# Patient Record
Sex: Female | Born: 2000 | Race: Black or African American | Hispanic: Yes | Marital: Single
Health system: Midwestern US, Community
[De-identification: ages and names within clinical notes are randomized; demographics above are authoritative.]

## PROBLEM LIST (undated history)

## (undated) DIAGNOSIS — N946 Dysmenorrhea, unspecified: Secondary | ICD-10-CM

## (undated) DIAGNOSIS — Z202 Contact with and (suspected) exposure to infections with a predominantly sexual mode of transmission: Secondary | ICD-10-CM

## (undated) DIAGNOSIS — Z0000 Encounter for general adult medical examination without abnormal findings: Secondary | ICD-10-CM

## (undated) DIAGNOSIS — B3731 Acute candidiasis of vulva and vagina: Secondary | ICD-10-CM

## (undated) DIAGNOSIS — N939 Abnormal uterine and vaginal bleeding, unspecified: Secondary | ICD-10-CM

---

## 2020-08-12 ENCOUNTER — Other Ambulatory Visit: Payer: Self-pay

## 2020-08-12 ENCOUNTER — Emergency Department (HOSPITAL_COMMUNITY): Payer: Self-pay

## 2020-08-12 ENCOUNTER — Emergency Department (HOSPITAL_COMMUNITY)
Admission: EM | Admit: 2020-08-12 | Discharge: 2020-08-12 | Disposition: A | Discharge status: Home / Self Care | Payer: Self-pay | Attending: Emergency Medicine | Admitting: Emergency Medicine

## 2020-08-12 DIAGNOSIS — R000 Tachycardia, unspecified: Secondary | ICD-10-CM | POA: Insufficient documentation

## 2020-08-12 DIAGNOSIS — Z20822 Contact with and (suspected) exposure to covid-19: Secondary | ICD-10-CM | POA: Insufficient documentation

## 2020-08-12 DIAGNOSIS — J101 Influenza due to other identified influenza virus with other respiratory manifestations: Secondary | ICD-10-CM | POA: Insufficient documentation

## 2020-08-12 LAB — CBC
HCT: 44 % (ref 36.0–46.0)
Hemoglobin: 14.6 g/dL (ref 12.0–15.0)
MCH: 29.9 pg (ref 26.0–34.0)
MCHC: 33.2 g/dL (ref 30.0–36.0)
MCV: 90.2 fL (ref 80.0–100.0)
Platelets: 322 10*3/uL (ref 150–400)
RBC: 4.88 MIL/uL (ref 3.87–5.11)
RDW: 12.4 % (ref 11.5–15.5)
WBC: 9 10*3/uL (ref 4.0–10.5)
nRBC: 0 % (ref 0.0–0.2)

## 2020-08-12 LAB — BASIC METABOLIC PANEL
Anion gap: 10 (ref 5–15)
BUN: 9 mg/dL (ref 6–20)
CO2: 24 mmol/L (ref 22–32)
Calcium: 9.5 mg/dL (ref 8.9–10.3)
Chloride: 98 mmol/L (ref 98–111)
Creatinine, Ser: 1.2 mg/dL — ABNORMAL HIGH (ref 0.44–1.00)
GFR, Estimated: >60 mL/min (ref >60)
Glucose, Bld: 97 mg/dL (ref 70–99)
Potassium: 3.8 mmol/L (ref 3.5–5.1)
Sodium: 132 mmol/L — ABNORMAL LOW (ref 135–145)

## 2020-08-12 LAB — URINALYSIS, ROUTINE W REFLEX MICROSCOPIC
Bilirubin Urine: NEGATIVE
Glucose, UA: NEGATIVE mg/dL
Hgb urine dipstick: NEGATIVE
Ketones, ur: 5 mg/dL — AB
Nitrite: NEGATIVE
Protein, ur: NEGATIVE mg/dL
Specific Gravity, Urine: 1.006 (ref 1.005–1.030)
pH: 7 (ref 5.0–8.0)

## 2020-08-12 LAB — TROPONIN I (HIGH SENSITIVITY)
Troponin I (High Sensitivity): 3 ng/L (ref <18)
Troponin I (High Sensitivity): 3 ng/L (ref <18)

## 2020-08-12 LAB — RESP PANEL BY RT-PCR (FLU A&B, COVID) ARPGX2
Influenza A by PCR: POSITIVE — AB
Influenza B by PCR: NEGATIVE
SARS Coronavirus 2 by RT PCR: NEGATIVE

## 2020-08-12 LAB — LACTIC ACID, PLASMA: Lactic Acid, Venous: 1.2 mmol/L (ref 0.5–1.9)

## 2020-08-12 LAB — I-STAT BETA HCG BLOOD, ED (MC, WL, AP ONLY): I-stat hCG, quantitative: <5 m[IU]/mL (ref <5)

## 2020-08-12 MED ORDER — ACETAMINOPHEN 325 MG PO TABS
650.0000 mg | ORAL_TABLET | Freq: Once | ORAL | Status: AC
  Administered 2020-08-12: 650 mg via ORAL
  Filled 2020-08-12: qty 2

## 2020-08-12 MED ORDER — KETOROLAC TROMETHAMINE 30 MG/ML IJ SOLN
15.0000 mg | Freq: Once | INTRAMUSCULAR | Status: AC
  Administered 2020-08-12: 15 mg via INTRAVENOUS
  Filled 2020-08-12: qty 1

## 2020-08-12 MED ORDER — SODIUM CHLORIDE 0.9 % IV BOLUS
1000.0000 mL | Freq: Once | INTRAVENOUS | Status: AC
  Administered 2020-08-12: 1000 mL via INTRAVENOUS

## 2020-08-12 NOTE — Discharge Instructions (Addendum)
Drink lots of fluids.  You may use Tylenol or ibuprofen for fevers or body aches.  Return to the emergency room if you have any worsening symptoms.  Your creatinine (kidney function) is mildly elevated and should be rechecked by your primary care doctor.

## 2020-08-12 NOTE — ED Triage Notes (Signed)
BIB [facility] with complaints of chest pain for 3 days after smoking marijuana and taking taking pill of unknown substance. Pt is Aox4, was ambulatory from ambulance to room with no issue.  HR 140-sinus tach BP-140/90  SpO2-99% Resp- 18

## 2020-08-12 NOTE — ED Provider Notes (Signed)
[facility] EMERGENCY DEPARTMENT Provider Note   CSN: [id number] Arrival date & time: 08/12/20  1550     History Chief Complaint  Patient presents with   Chest Pain    Kristen Sanders is a 20 y.o. female.  Patient is a 20 year old female who presents with chest pain.  She reports pain in the center of her chest for the last 3 days.  She describes it as a sharp pain.  Its worse with certain movements.  Its not worse with breathing.  She has no associated shortness of breath.  She is febrile here and says that she has had a cough for the last few days.  No nausea or vomiting.  No known sick contacts.  She does report that she has been smoking marijuana and vaping.  She did take a pill last night.  She does not know what was in it.  She was not able to sleep through the night.  However the chest pain started prior to this.      No past medical history on file.  There are no problems to display for this patient.   The histories are not reviewed yet. Please review them in the "History" navigator section and refresh this SmartLink.   OB History   No obstetric history on file.     No family history on file.     Home Medications Prior to Admission medications   Not on File    Allergies    Patient has no known allergies.  Review of Systems   Review of Systems  Constitutional:  Positive for fatigue and fever. Negative for chills and diaphoresis.  HENT:  Negative for congestion, rhinorrhea and sneezing.   Eyes: Negative.   Respiratory:  Positive for cough. Negative for chest tightness and shortness of breath.   Cardiovascular:  Positive for chest pain. Negative for leg swelling.  Gastrointestinal:  Negative for abdominal pain, blood in stool, diarrhea, nausea and vomiting.  Genitourinary:  Negative for difficulty urinating, flank pain, frequency and hematuria.  Musculoskeletal:  Negative for arthralgias and back pain.  Skin:  Negative for rash.   Neurological:  Negative for dizziness, speech difficulty, weakness, numbness and headaches.   Physical Exam Updated Vital Signs BP 134/87   Pulse (!) 119   Temp 99.7 F (37.6 C) (Oral)   Resp (!) 27   Ht 5\' 7"  (1.702 m)   Wt 90.7 kg   SpO2 94%   BMI 31.32 kg/m   Physical Exam Constitutional:      Appearance: She is well-developed.  HENT:     Head: Normocephalic and atraumatic.  Eyes:     Pupils: Pupils are equal, round, and reactive to light.  Cardiovascular:     Rate and Rhythm: Regular rhythm. Tachycardia present.     Heart sounds: Normal heart sounds.  Pulmonary:     Effort: Pulmonary effort is normal. No respiratory distress.     Breath sounds: Normal breath sounds. No wheezing or rales.  Chest:     Chest wall: No tenderness.  Abdominal:     General: Bowel sounds are normal.     Palpations: Abdomen is soft.     Tenderness: There is no abdominal tenderness. There is no guarding or rebound.  Musculoskeletal:        General: Normal range of motion.     Cervical back: Normal range of motion and neck supple.     Comments: No peripheral edema  Lymphadenopathy:  Cervical: No cervical adenopathy.  Skin:    General: Skin is warm and dry.     Findings: No rash.  Neurological:     Mental Status: She is alert and oriented to person, place, and time.    ED Results / Procedures / Treatments   Labs (all labs ordered are listed, but only abnormal results are displayed) Labs Reviewed  RESP PANEL BY RT-PCR (FLU A&B, COVID) ARPGX2 - Abnormal; Notable for the following components:      Result Value   Influenza A by PCR POSITIVE (*)    All other components within normal limits  BASIC METABOLIC PANEL - Abnormal; Notable for the following components:   Sodium 132 (*)    Creatinine, Ser 1.20 (*)    All other components within normal limits  URINALYSIS, ROUTINE W REFLEX MICROSCOPIC - Abnormal; Notable for the following components:   Ketones, ur 5 (*)    Leukocytes,Ua  SMALL (*)    Bacteria, UA RARE (*)    All other components within normal limits  CBC  LACTIC ACID, PLASMA  I-STAT BETA HCG BLOOD, ED ([facility], [facility], [facility] ONLY)  TROPONIN I (HIGH SENSITIVITY)  TROPONIN I (HIGH SENSITIVITY)    EKG EKG Interpretation  Date/Time:  Tuesday August 12 2020 15:58:42 EDT Ventricular Rate:  116 PR Interval:  124 QRS Duration: 76 QT Interval:  310 QTC Calculation: 431 R Axis:   63 Text Interpretation: Sinus tachycardia Borderline T wave abnormalities Confirmed by Rolan Bucco [phone]) on 08/12/2020 4:03:09 PM  Radiology DG Chest Portable 1 View  Result Date: 08/12/2020 CLINICAL DATA:  Chest pain for 3 days. EXAM: PORTABLE CHEST 1 VIEW COMPARISON:  None. FINDINGS: Lungs clear. Heart size normal. No pneumothorax or pleural fluid. No bony abnormality. IMPRESSION: Negative chest. Electronically Signed   By: Drusilla Kanner M.D.   On: 08/12/2020 17:05    Procedures Procedures   Medications Ordered in ED Medications  ketorolac (TORADOL) 30 MG/ML injection 15 mg (has no administration in time range)  acetaminophen (TYLENOL) tablet 650 mg (650 mg Oral Given 08/12/20 1636)  sodium chloride 0.9 % bolus 1,000 mL (0 mLs Intravenous Stopped 08/12/20 1800)    ED Course  I have reviewed the triage vital signs and the nursing notes.  Pertinent labs & imaging results that were available during my care of the patient were reviewed by me and considered in my medical decision making (see chart for details).    MDM Rules/Calculators/A&P                          Patient is a 20 year old who presents with cough and some chest pain.  Chest x-ray is clear without evidence of pneumonia or pneumothorax.  She is febrile.  Her COVID test is negative.  Her influenza test is positive.  Her labs are nonconcerning.  Her creatinine is mildly elevated.  Her lactate is normal.  She had some tachycardia on arrival up to 130.  This improved greatly with IV fluids.  Her heart rate now is around  100-105.  She feels much better.  She has minimal ongoing chest pain only with coughing.  No shortness of breath.  No other symptoms that sound more concerning for PE.  She was discharged home in good condition.  Symptomatic care instructions were given.  Return precautions were given. Final Clinical Impression(s) / ED Diagnoses Final diagnoses:  Influenza A    Rx / DC Orders ED Discharge Orders  None        Rolan Bucco, MD 08/12/20 2023

## 2021-11-03 ENCOUNTER — Encounter: Admit: 2021-11-03 | Discharge: 2021-11-03 | Payer: MEDICAID | Primary: Diagnostic Radiology

## 2021-11-03 DIAGNOSIS — Z124 Encounter for screening for malignant neoplasm of cervix: Secondary | ICD-10-CM

## 2021-11-03 MED ORDER — MEDROXYPROGESTERONE ACETATE 150 MG/ML IM SUSP
150 | INTRAMUSCULAR | 3 refills | 90.00000 days | Status: DC
Start: 2021-11-03 — End: 2021-11-09

## 2021-11-03 NOTE — Progress Notes (Signed)
Diana Payne is a 21 y.o. female who presents to the office today for the following:    Chief Complaint   Patient presents with    New Patient     Establish care.       History reviewed. No pertinent past medical history.     History reviewed. No pertinent surgical history.     History reviewed. No pertinent family history.     Social History     Tobacco Use    Smoking status: Some Days     Packs/day: .25     Types: Cigarettes    Smokeless tobacco: Never   Vaping Use    Vaping Use: Every day    Substances: THC    Devices: Disposable   Substance Use Topics    Alcohol use: Yes     Comment: Drinks at least 3 times per week.    Drug use: Yes     Frequency: 7.0 times per week     Types: Marijuana [name])     Comment: Stated smokes weed every day.        HPI  Patient here to establish as new patient with provider with PMH anxiety, depression, dysmenorrhea, tobacco dependence and maijuana use. Follows with psychiatry and therapist. Previously received Depo injection from [facility] prior to relocating to area. Patient endorses heavy irregular menstrual cycles with clots and lower abdominal pain prior to starting depo 3 years ago.    Recently treated for chlamydia. Completed abx this week. Unknown if partner treated. Would like to be re-screened. Denies symptoms today.    Chief Complaint   Patient presents with    New Patient     Establish care.       Current Outpatient Medications on File Prior to Visit   Medication Sig Dispense Refill    sertraline (ZOLOFT) 25 MG tablet Take 1 tablet by mouth daily       No current facility-administered medications on file prior to visit.        Current Outpatient Medications   Medication Sig Dispense Refill    sertraline (ZOLOFT) 25 MG tablet Take 1 tablet by mouth daily      medroxyPROGESTERone (DEPO-PROVERA) 150 MG/ML injection Inject 1 mL into the muscle every 3 months 1 mL 3     No current facility-administered medications for this visit.          Review of Systems    Constitutional: Negative.    HENT: Negative.     Eyes: Negative.    Respiratory: Negative.     Cardiovascular: Negative.    Gastrointestinal: Negative.    Endocrine: Negative.    Genitourinary: Negative.    Musculoskeletal: Negative.    Skin: Negative.    Neurological: Negative.    Hematological: Negative.    Psychiatric/Behavioral:  Negative for agitation, behavioral problems, confusion, decreased concentration, dysphoric mood, hallucinations, self-injury, sleep disturbance and suicidal ideas. The patient is nervous/anxious. The patient is not hyperactive.         Depression         BP (!) 104/58 (Site: Left Upper Arm, Position: Sitting, Cuff Size: Large Adult)   Pulse 78   Temp 97.6 F (36.4 C) (Oral)   Resp 20   Ht 5\' 7"  (1.702 m)   Wt 193 lb 8 oz (87.8 kg)   LMP  (LMP Unknown)   SpO2 99%   BMI 30.31 kg/m       Physical Exam  Constitutional:  Appearance: Normal appearance.   HENT:      Head: Normocephalic and atraumatic.   Cardiovascular:      Rate and Rhythm: Normal rate and regular rhythm.      Heart sounds: Normal heart sounds.   Pulmonary:      Effort: Pulmonary effort is normal.      Breath sounds: Normal breath sounds.   Abdominal:      General: Abdomen is flat. Bowel sounds are normal.      Palpations: Abdomen is soft.   Genitourinary:     General: Normal vulva.      Rectum: Normal.   Neurological:      General: No focal deficit present.      Mental Status: She is alert and oriented to person, place, and time.   Psychiatric:         Mood and Affect: Mood normal.         Behavior: Behavior normal.         Thought Content: Thought content normal.         Judgment: Judgment normal.          1. Encounter for Papanicolaou smear for cervical cancer screening  -     PAP IG, CT-NG-TV, Aptima HPV and rfx 16/18,45 ([id number])  PAP obtained with minimal discomfort reported by patient.   Educated patient that pending WNL will repeat in 3 years.    2. Anxiety and depression      This is stable. PHQ2 score  of 1.      Follows with psychiatry and therapist.     Continue Zoloft as directed.        Denies SI HI and AVH. If SI HI or AVH contact provider immediately or seek ER care.       Discussed adjuvant therapy to include meditation, massage, and deep breathing exercises.       Patient declines referral to psychiatry.     3. Dysmenorrhea  -     medroxyPROGESTERone (DEPO-PROVERA) 150 MG/ML injection; Inject 1 mL into the muscle every 3 months, Disp-1 mL, R-3NO PRINT  Patient has been taking Depo-Provera for 3 years for control of heavy painful menstrual cycles. Denies menstrual cycle with use.  Last injection June 2023 and is currently past due.  Will refill Depo now. Educated to return to office for POC Urine Pregnancy and Depo administration upon receipt of injectable from pharmacy.  Discussed risk factors such as depression, mood changes and weight gain with use. Patient denies symptoms.     4. Encounter for annual routine gynecological examination  Vaginal exam completed via speculum with minimal discomfort reported by patient.   No adnexal tenderness with palpation.    5. Screen for STD (sexually transmitted disease)  -     PAP IG, CT-NG-TV, Aptima HPV and rfx 16/18,45 ([id number])  Recent chlamydial infection. Completed ABX last week. Would like re-screen today. Will check swab now.  Educated patient on importance of partner treatment to reduce risk of reinfection.  Educated patient on importance of routine condom use to reduce risk of STD exposure.     6. Tobacco user  Recommended to work on cessation to reduce risk of COPD, shortness of breath, oxygen dependence, CKD, CVD, cancer or death.        We discussed the expected course, resolution and complications of the diagnosis(es) in detail.  Medication risks, benefits, costs, interactions, and alternatives were discussed as indicated.  I advised her to contact the office if  her condition worsens, changes or fails to improve as anticipated. She expressed understanding  with the diagnosis(es) and plan.     Patient verbalizes understanding of plan of care as discussed above    Return if symptoms worsen or fail to improve.

## 2021-11-09 MED ORDER — MEDROXYPROGESTERONE ACETATE 150 MG/ML IM SUSP
150 | INTRAMUSCULAR | 3 refills | 90.00000 days | Status: DC
Start: 2021-11-09 — End: 2022-10-27

## 2021-11-09 NOTE — Telephone Encounter (Signed)
Patient called in and wanted to know if the medication medroxyPROGESTERone (DEPO-PROVERA) 150 MG/ML injection was sent to the [facility].  I didn't see where it was sent.

## 2021-11-09 NOTE — Addendum Note (Signed)
Addended by: Harlow Ohms on: 11/09/2021 07:40 PM     Modules accepted: Orders

## 2021-11-10 LAB — PAP IG, CT-NG-TV, APTIMA HPV AND RFX 16/18,45 (199315)
.: 0
Chlamydia trachomatis, NAA: NEGATIVE
HPV Aptima: NEGATIVE
Neisseria Gonorrhoeae, NAA: NEGATIVE
Trichomonas Vaginalis by NAA: NEGATIVE

## 2021-11-25 ENCOUNTER — Encounter: Admit: 2021-11-25 | Discharge: 2021-11-25 | Payer: MEDICAID

## 2021-11-25 DIAGNOSIS — N946 Dysmenorrhea, unspecified: Secondary | ICD-10-CM

## 2021-11-25 LAB — AMB POC URINE PREGNANCY TEST, VISUAL COLOR COMPARISON: HCG, Pregnancy, Urine, POC: NEGATIVE

## 2021-11-25 MED ORDER — MEDROXYPROGESTERONE ACETATE 150 MG/ML IM SUSP
150 MG/ML | Freq: Once | INTRAMUSCULAR | Status: AC
Start: 2021-11-25 — End: 2021-11-25
  Administered 2021-11-25: 19:00:00 150 mg via INTRAMUSCULAR

## 2021-12-02 ENCOUNTER — Encounter

## 2021-12-04 LAB — URINALYSIS WITH MICROSCOPIC
Bilirubin Urine: NEGATIVE
Blood, Urine: NEGATIVE
Glucose, Ur: NEGATIVE
Ketones, Urine: NEGATIVE
Leukocyte Esterase, Urine: NEGATIVE
Nitrite, Urine: NEGATIVE
Protein, UA: NEGATIVE
Specific Gravity, UA: 1.02 (ref 1.005–1.030)
Urobilinogen, Urine: 1 mg/dL (ref 0.2–1.0)
pH, UA: 5.5 (ref 5.0–7.5)

## 2021-12-04 LAB — CBC WITH AUTO DIFFERENTIAL
Basophils %: 1 %
Basophils Absolute: 0.1 10*3/uL (ref 0.0–0.2)
Eosinophils %: 3 %
Eosinophils Absolute: 0.3 10*3/uL (ref 0.0–0.4)
Hematocrit: 44.6 % (ref 34.0–46.6)
Hemoglobin: 14.8 g/dL (ref 11.1–15.9)
Immature Grans (Abs): 0 10*3/uL (ref 0.0–0.1)
Immature Granulocytes %: 0 %
Lymphocytes %: 30 %
Lymphocytes Absolute: 2.9 10*3/uL (ref 0.7–3.1)
MCH: 29.7 pg (ref 26.6–33.0)
MCHC: 33.2 g/dL (ref 31.5–35.7)
MCV: 89 fL (ref 79–97)
Monocytes %: 6 %
Monocytes Absolute: 0.6 10*3/uL (ref 0.1–0.9)
Neutrophils %: 60 %
Neutrophils Absolute: 5.9 10*3/uL (ref 1.4–7.0)
Platelets: 357 10*3/uL (ref 150–450)
RBC: 4.99 x10E6/uL (ref 3.77–5.28)
RDW: 12.9 % (ref 11.7–15.4)
WBC: 9.9 10*3/uL (ref 3.4–10.8)

## 2021-12-04 LAB — LIPID PANEL
Cholesterol: 174 mg/dL (ref 100–199)
HDL: 32 mg/dL — ABNORMAL LOW (ref >39)
LDL Calculated: 118 mg/dL — ABNORMAL HIGH (ref 0–99)
Triglycerides: 132 mg/dL (ref 0–149)
VLDL Cholesterol Calculated: 24 mg/dL (ref 5–40)

## 2021-12-04 LAB — MICROSCOPIC EXAMINATION
Casts UA: NONE SEEN /lpf
RBC, UA: NONE SEEN /hpf (ref 0–2)

## 2021-12-04 LAB — COMPREHENSIVE METABOLIC PANEL
ALT: 10 IU/L (ref 0–32)
AST: 12 IU/L (ref 0–40)
Albumin/Globulin Ratio: 1.4 (ref 1.2–2.2)
Albumin: 4.4 g/dL (ref 4.0–5.0)
Alkaline Phosphatase: 76 IU/L (ref 44–121)
BUN/Creatinine Ratio: 7 — ABNORMAL LOW (ref 9–23)
BUN: 8 mg/dL (ref 6–20)
CO2: 24 mmol/L (ref 20–29)
Calcium: 9.4 mg/dL (ref 8.7–10.2)
Chloride: 101 mmol/L (ref 96–106)
Creatinine: 1.12 mg/dL — ABNORMAL HIGH (ref 0.57–1.00)
Est, Glom Filt Rate: 71 mL/min/1.73 (ref >59)
Globulin, Total: 3.1 g/dL (ref 1.5–4.5)
Glucose: 61 mg/dL — ABNORMAL LOW (ref 70–99)
Potassium: 3.9 mmol/L (ref 3.5–5.2)
Sodium: 139 mmol/L (ref 134–144)
Total Bilirubin: 0.2 mg/dL (ref 0.0–1.2)
Total Protein: 7.5 g/dL (ref 6.0–8.5)

## 2021-12-04 LAB — HEMOGLOBIN A1C: Hemoglobin A1C: 5.2 % (ref 4.8–5.6)

## 2021-12-04 LAB — TSH + FREE T4 PANEL
T4 Free: 1.23 ng/dL (ref 0.82–1.77)
TSH: 3.8 u[IU]/mL (ref 0.450–4.500)

## 2021-12-22 ENCOUNTER — Ambulatory Visit: Admit: 2021-12-22 | Discharge: 2021-12-22 | Payer: MEDICAID

## 2021-12-22 DIAGNOSIS — Z113 Encounter for screening for infections with a predominantly sexual mode of transmission: Secondary | ICD-10-CM

## 2021-12-22 NOTE — Progress Notes (Signed)
Diana Payne is a 21 y.o. female who presents to the office today for the following:    Chief Complaint   Patient presents with    Sexually Transmitted Diseases     Partner treated for STD        History reviewed. No pertinent past medical history.     History reviewed. No pertinent surgical history.     History reviewed. No pertinent family history.     Social History     Tobacco Use    Smoking status: Some Days     Packs/day: .25     Types: Cigarettes    Smokeless tobacco: Never   Vaping Use    Vaping Use: Every day    Substances: THC    Devices: Disposable   Substance Use Topics    Alcohol use: Yes     Comment: Drinks at least 3 times per week.    Drug use: Yes     Frequency: 7.0 times per week     Types: Marijuana Diana Payne)     Comment: Stated smokes weed every day.        HPI  Patient here for STD screenings with PMH anxiety, depression, tobacco dependence, marijuana use and dysmenorrhea. States that she does not have protected sex. Denies symptoms. Unknown if exposed however previous partner was treated for unknown STD recently.     Chief Complaint   Patient presents with    Sexually Transmitted Diseases     Partner treated for STD        Current Outpatient Medications on File Prior to Visit   Medication Sig Dispense Refill    medroxyPROGESTERone (DEPO-PROVERA) 150 MG/ML injection Inject 1 mL into the muscle every 3 months 1 mL 3    sertraline (ZOLOFT) 25 MG tablet Take 1 tablet by mouth daily       No current facility-administered medications on file prior to visit.        Current Outpatient Medications   Medication Sig Dispense Refill    medroxyPROGESTERone (DEPO-PROVERA) 150 MG/ML injection Inject 1 mL into the muscle every 3 months 1 mL 3    sertraline (ZOLOFT) 25 MG tablet Take 1 tablet by mouth daily       No current facility-administered medications for this visit.          Review of Systems   Constitutional:  Negative for activity change, chills, fatigue and fever.   HENT:  Negative for congestion,  ear pain, postnasal drip, rhinorrhea, sinus pain, sneezing and sore throat.    Eyes:  Negative for pain and visual disturbance.   Cardiovascular:  Negative for chest pain, palpitations and leg swelling.   Gastrointestinal:  Negative for abdominal pain, blood in stool, constipation, diarrhea, nausea and vomiting.   Genitourinary:  Negative for difficulty urinating, dysuria, flank pain, frequency, hematuria and urgency.   Musculoskeletal:  Negative for arthralgias, back pain, gait problem, joint swelling and myalgias.   Skin: Negative.    Neurological:  Negative for dizziness, syncope, speech difficulty, light-headedness, numbness and headaches.   Psychiatric/Behavioral:  Negative for agitation, behavioral problems, confusion, decreased concentration, dysphoric mood, hallucinations, self-injury, sleep disturbance and suicidal ideas. The patient is not nervous/anxious and is not hyperactive.          BP 108/66 (Site: Left Upper Arm, Position: Sitting, Cuff Size: Large Adult)   Pulse 60   Temp 98.3 F (36.8 C) (Oral)   Resp 18   Ht 1.702 m (5\' 7" )   Wt  86.5 kg (190 lb 12.8 oz)   SpO2 99%   BMI 29.88 kg/m     Last Point of Care HGB A1C  No results found for: "HBA1CPOC"      Physical Exam  Constitutional:       Appearance: Normal appearance.   HENT:      Head: Normocephalic and atraumatic.   Pulmonary:      Effort: Pulmonary effort is normal.   Abdominal:      General: Abdomen is flat. Bowel sounds are normal.      Palpations: Abdomen is soft.   Genitourinary:     General: Normal vulva.      Vagina: No vaginal discharge.      Rectum: Normal.   Skin:     General: Skin is warm and dry.   Neurological:      General: No focal deficit present.      Mental Status: She is alert and oriented to person, place, and time.   Psychiatric:         Mood and Affect: Mood normal.         Behavior: Behavior normal.         Thought Content: Thought content normal.         Judgment: Judgment normal.          1. Screen for STD  (sexually transmitted disease)  -     NuSwab Vaginitis Plus (VG+) with Candida (Six Species)  -     HIV-1 RNA, quantitative, PCR  -     Hepatitis Panel, Acute  -     T. pallidum Ab  Nuswab obtained via vaginal exam with speculum, minimal discomfort reported.  No adnexal tenderness with bimanual exam.  Unknown if exposed therefore will wait for results to determine need for treatment.  Discussed safe sex to include condom use.   Will return next week when lab is open for blood work.          We discussed the expected course, resolution and complications of the diagnosis(es) in detail.  Medication risks, benefits, costs, interactions, and alternatives were discussed as indicated.  I advised her to contact the office if her condition worsens, changes or fails to improve as anticipated. She expressed understanding with the diagnosis(es) and plan.     Patient verbalizes understanding of plan of care as discussed above    Return if symptoms worsen or fail to improve.

## 2021-12-22 NOTE — Progress Notes (Signed)
Chief Complaint   Patient presents with    Sexually Transmitted Diseases     Partner treated for STD      1. Have you been to the ER, urgent care clinic since your last visit?  Hospitalized since your last visit?No    2. Have you seen or consulted any other health care providers outside of the [facility] since your last visit?  Include any pap smears or colon screening. No  BP 108/66 (Site: Left Upper Arm, Position: Sitting, Cuff Size: Large Adult)   Pulse 60   Temp 98.3 F (36.8 C) (Oral)   Resp 18   Ht 1.702 m (5\' 7" )   Wt 86.5 kg (190 lb 12.8 oz)   SpO2 99%   BMI 29.88 kg/m

## 2021-12-26 LAB — NUSWAB VAGINITIS PLUS (VG+) WITH CANDIDA (SIX SPECIES)
Candida Lusitaniae, NAA: NEGATIVE
Candida Parapsilosis/Tropicalis: NEGATIVE
Candida albicans, NAA: NEGATIVE
Candida glabrata: NEGATIVE
Candida krusei: NEGATIVE
Chlamydia trachomatis, NAA: NEGATIVE
Neisseria Gonorrhoeae, NAA: NEGATIVE
Trichomonas Vaginalis by NAA: NEGATIVE

## 2022-02-16 ENCOUNTER — Encounter: Admit: 2022-02-16 | Discharge: 2022-02-16 | Payer: MEDICAID

## 2022-02-16 DIAGNOSIS — N946 Dysmenorrhea, unspecified: Secondary | ICD-10-CM

## 2022-02-16 LAB — AMB POC URINE PREGNANCY TEST, VISUAL COLOR COMPARISON: HCG, Pregnancy, Urine, POC: NEGATIVE

## 2022-02-16 MED ORDER — MEDROXYPROGESTERONE ACETATE 150 MG/ML IM SUSP
150 MG/ML | Freq: Once | INTRAMUSCULAR | Status: AC
Start: 2022-02-16 — End: 2022-02-16
  Administered 2022-02-16: 16:00:00 150 mg via INTRAMUSCULAR

## 2022-02-16 NOTE — Progress Notes (Signed)
Patient in for Depo injection 150 mg/ml.  Urine pregnancy test done/negative.  Injection given in left hip.  Patient tolerated procedure well and offered no complaints.

## 2022-02-24 NOTE — Telephone Encounter (Signed)
Patient states she received Depo injection on 1/16; now she is having some bleeding.   Please advise.

## 2022-02-25 NOTE — Telephone Encounter (Signed)
Patient stated she has had the bleeding for a week now and has to wear a pad. She stated the bleeding is just like when you first start a period.

## 2022-02-25 NOTE — Telephone Encounter (Signed)
Informed patient of recommendations per K. Powell,NP. Patient stated understanding and will call back on Monday if needed.

## 2022-03-01 ENCOUNTER — Encounter

## 2022-03-01 MED ORDER — MEDROXYPROGESTERONE ACETATE 10 MG PO TABS
10 | ORAL_TABLET | Freq: Every day | ORAL | 0 refills | 90.00000 days | Status: DC
Start: 2022-03-01 — End: 2022-03-02

## 2022-03-01 NOTE — Progress Notes (Signed)
Patient is having break thru bleeding now for > 2 weeks with Depo use. Will send Provera now to stop bleeding. Patient previously advised that breakthru bleeding is possible side effect with Depo use.

## 2022-03-01 NOTE — Telephone Encounter (Signed)
LVM a new RX has been sent to pharmacy for patient.

## 2022-03-01 NOTE — Telephone Encounter (Signed)
Patient left message that the bleeding has not stopped.

## 2022-03-02 ENCOUNTER — Encounter: Admit: 2022-03-02 | Discharge: 2022-03-02 | Payer: MEDICAID

## 2022-03-02 DIAGNOSIS — R319 Hematuria, unspecified: Secondary | ICD-10-CM

## 2022-03-02 LAB — AMB POC URINALYSIS DIP STICK AUTO W/O MICRO
Bilirubin, Urine, POC: NEGATIVE
Blood, Urine, POC: NEGATIVE
Glucose, Urine, POC: NEGATIVE
Ketones, Urine, POC: NEGATIVE
Leukocyte Esterase, Urine, POC: NEGATIVE
Nitrite, Urine, POC: NEGATIVE
Specific Gravity, Urine, POC: 1.03 (ref 1.001–1.035)
Urobilinogen, POC: 0.2
pH, Urine, POC: 6 (ref 4.6–8.0)

## 2022-03-02 MED ORDER — MEDROXYPROGESTERONE ACETATE 10 MG PO TABS
10 | ORAL_TABLET | Freq: Every day | ORAL | 0 refills | 90.00000 days | Status: DC
Start: 2022-03-02 — End: 2022-05-18

## 2022-03-02 MED ORDER — SULFAMETHOXAZOLE-TRIMETHOPRIM 800-160 MG PO TABS
800-160 | ORAL_TABLET | Freq: Two times a day (BID) | ORAL | 0 refills | Status: AC
Start: 2022-03-02 — End: 2022-03-09
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
## 2022-03-02 NOTE — Progress Notes (Signed)
Chief Complaint   Patient presents with    Vaginal Bleeding     States has not gotten Provera from Pharmacy yet.  Wants it sent to [facility] in [place].  It was sent to [facility].     1. Have you been to the ER, urgent care clinic since your last visit?  Hospitalized since your last visit?No    2. Have you seen or consulted any other health care providers outside of the [facility] since your last visit?  Include any pap smears or colon screening. No

## 2022-03-16 NOTE — Telephone Encounter (Signed)
Pt wanted to let you know that her bleeding stopped.

## 2022-03-30 NOTE — Telephone Encounter (Signed)
Pt is requesting a call back in ref to test results

## 2022-04-01 NOTE — Telephone Encounter (Signed)
Unable to reach patient.  The [phone] # is a business number and the [phone] has no mail box set up.  Will try later.

## 2022-04-02 NOTE — Telephone Encounter (Signed)
Unable to reach patient.  Letter sent.

## 2022-04-27 ENCOUNTER — Encounter: Admit: 2022-04-27 | Discharge: 2022-04-27 | Payer: MEDICAID

## 2022-04-27 DIAGNOSIS — Z113 Encounter for screening for infections with a predominantly sexual mode of transmission: Secondary | ICD-10-CM

## 2022-04-27 NOTE — Progress Notes (Signed)
Diana Payne is a 22 y.o. female who presents to the office today for the following:    Chief Complaint   Patient presents with    Sexually Transmitted Diseases     Would like to be tested       History reviewed. No pertinent past medical history.     History reviewed. No pertinent surgical history.     History reviewed. No pertinent family history.     Social History     Tobacco Use    Smoking status: Some Days     Current packs/day: 0.25     Types: Cigarettes    Smokeless tobacco: Never   Vaping Use    Vaping Use: Every day    Substances: THC    Devices: Disposable   Substance Use Topics    Alcohol use: Yes     Comment: Drinks at least 3 times per week.    Drug use: Yes     Frequency: 7.0 times per week     Types: Marijuana [name])     Comment: Stated smokes weed every day.        HPI  Patient here for STD screenings. Patient states someone in home has trichomonas and she wants to make sure she does not have it as well. Denies sexual activity with infected party. Denies vaginal symptoms.    Chief Complaint   Patient presents with    Sexually Transmitted Diseases     Would like to be tested       Current Outpatient Medications on File Prior to Visit   Medication Sig Dispense Refill    medroxyPROGESTERone (DEPO-PROVERA) 150 MG/ML injection Inject 1 mL into the muscle every 3 months 1 mL 3    sertraline (ZOLOFT) 25 MG tablet Take 1 tablet by mouth daily      medroxyPROGESTERone (PROVERA) 10 MG tablet Take 1 tablet by mouth daily (Patient not taking: Reported on 04/27/2022) 7 tablet 0     No current facility-administered medications on file prior to visit.        Current Outpatient Medications   Medication Sig Dispense Refill    medroxyPROGESTERone (DEPO-PROVERA) 150 MG/ML injection Inject 1 mL into the muscle every 3 months 1 mL 3    sertraline (ZOLOFT) 25 MG tablet Take 1 tablet by mouth daily      medroxyPROGESTERone (PROVERA) 10 MG tablet Take 1 tablet by mouth daily (Patient not taking: Reported on 04/27/2022)  7 tablet 0     No current facility-administered medications for this visit.          Review of Systems   Constitutional: Negative.    HENT: Negative.     Respiratory: Negative.     Cardiovascular: Negative.    Gastrointestinal: Negative.    Genitourinary: Negative.    Musculoskeletal: Negative.    Skin: Negative.    Neurological: Negative.    Psychiatric/Behavioral: Negative.           BP 110/68 (Site: Left Upper Arm, Position: Sitting, Cuff Size: Medium Adult)   Pulse 74   Temp 98.4 F (36.9 C) (Oral)   Resp 18   Ht 1.702 m (5\' 7" )   Wt 86.3 kg (190 lb 3.2 oz)   SpO2 98%   BMI 29.79 kg/m     Last Point of Care HGB A1C  No results found for: "HBA1CPOC"      Physical Exam  Constitutional:       Appearance: Normal appearance.   Pulmonary:  Effort: Pulmonary effort is normal.   Abdominal:      General: Abdomen is flat. Bowel sounds are normal.      Palpations: Abdomen is soft.   Genitourinary:     General: Normal vulva.      Vagina: No vaginal discharge.      Rectum: Normal.   Skin:     General: Skin is warm and dry.   Neurological:      General: No focal deficit present.      Mental Status: She is alert and oriented to person, place, and time.   Psychiatric:         Mood and Affect: Mood normal.         Behavior: Behavior normal.         Thought Content: Thought content normal.         Judgment: Judgment normal.          1. Screen for STD (sexually transmitted disease)  -     NuSwab Vaginitis Plus (VG+) with Candida (Six Species)  Nuswab obtained with minimal discomfort reported by patient.  Will await results.  Discussed safe sex practices to include condom use.    2. Encounter for gynecological examination without abnormal finding  Vaginal exam performed via speculum with minimal discomfort reported by patient.  Negative for adnexal tenderness with bimanual exam.       We discussed the expected course, resolution and complications of the diagnosis(es) in detail.  Medication risks, benefits, costs,  interactions, and alternatives were discussed as indicated.  I advised her to contact the office if her condition worsens, changes or fails to improve as anticipated. She expressed understanding with the diagnosis(es) and plan.     Patient verbalizes understanding of plan of care as discussed above    Return if symptoms worsen or fail to improve.

## 2022-04-27 NOTE — Progress Notes (Signed)
"  Have you been to the ER, urgent care clinic since your last visit?  Hospitalized since your last visit?"    NO    "Have you seen or consulted any other health care providers outside of [facility] since your last visit?"    NO            Click Here for Release of Records Request

## 2022-04-30 LAB — NUSWAB VAGINITIS PLUS (VG+) WITH CANDIDA (SIX SPECIES)
Candida Lusitaniae, NAA: NEGATIVE
Candida Parapsilosis/Tropicalis: NEGATIVE
Candida albicans, NAA: NEGATIVE
Candida glabrata: NEGATIVE
Candida krusei: NEGATIVE
Chlamydia trachomatis, NAA: NEGATIVE
Neisseria Gonorrhoeae, NAA: NEGATIVE
Trichomonas Vaginalis by NAA: NEGATIVE

## 2022-05-18 ENCOUNTER — Encounter

## 2022-05-18 MED ORDER — CEFIXIME 400 MG PO CAPS
400 | ORAL_CAPSULE | Freq: Once | ORAL | 0 refills | Status: AC
Start: 2022-05-18 — End: 2022-05-18

## 2022-05-18 NOTE — Progress Notes (Signed)
Patient has known exposure to gonorrhea by partner. She is having vaginal itching/discomfort and yellow discharge. She does not have active insurance and is unable to pay copay for visit. Discussed with patient we could do [place] for IM Rocephin but she cannot afford that. Discussed that preferred option is IM rocephin however since patient cannot be seen, will treat with cefixime PO x1 dose now. Advised patient if symptoms do not resolve she will need to be seen for STD swab.

## 2022-05-21 ENCOUNTER — Ambulatory Visit: Admit: 2022-05-21 | Discharge: 2022-05-21 | Payer: MEDICAID

## 2022-05-21 DIAGNOSIS — N939 Abnormal uterine and vaginal bleeding, unspecified: Secondary | ICD-10-CM

## 2022-05-21 LAB — AMB POC URINE PREGNANCY TEST, VISUAL COLOR COMPARISON: HCG, Pregnancy, Urine, POC: NEGATIVE

## 2022-05-21 MED ORDER — FLUCONAZOLE 150 MG PO TABS
150 | ORAL_TABLET | Freq: Once | ORAL | 0 refills | 6.00000 days | Status: AC
Start: 2022-05-21 — End: 2022-05-21

## 2022-05-21 MED ORDER — MEDROXYPROGESTERONE ACETATE 150 MG/ML IM SUSP
150 | Freq: Once | INTRAMUSCULAR | Status: AC
Start: 2022-05-21 — End: 2022-05-21
  Administered 2022-05-21: 16:00:00 150 mg via INTRAMUSCULAR

## 2022-05-21 MED ORDER — CEFTRIAXONE SODIUM 1 G IJ SOLR
1 | Freq: Once | INTRAMUSCULAR | Status: AC
Start: 2022-05-21 — End: 2022-05-21
  Administered 2022-05-21: 16:00:00 1000 mg via INTRAMUSCULAR

## 2022-05-21 NOTE — Progress Notes (Signed)
Chief Complaint   Patient presents with    Sexually Transmitted Diseases     To be checked for STD's.    Injections     For Depo injection.     "Have you been to the ER, urgent care clinic since your last visit?  Hospitalized since your last visit?"    NO    "Have you seen or consulted any other health care providers outside of [facility] since your last visit?"    NO            Click Here for Release of Records Request

## 2022-05-21 NOTE — Progress Notes (Signed)
Diana Payne is a 22 y.o. female who presents to the office today for the following:    Chief Complaint   Patient presents with    Sexually Transmitted Diseases     To be checked for STD's.    Injections     For Depo injection.       Past Medical History:   Diagnosis Date    Anxiety     Depression     Substance abuse (HCC)         History reviewed. No pertinent surgical history.     History reviewed. No pertinent family history.     Social History     Tobacco Use    Smoking status: Some Days     Current packs/day: 0.25     Types: Cigarettes    Smokeless tobacco: Never   Vaping Use    Vaping Use: Every day    Substances: THC    Devices: Disposable   Substance Use Topics    Alcohol use: Yes     Comment: Drinks at least 3 times per week.    Drug use: Yes     Frequency: 7.0 times per week     Types: Marijuana Sheran Fava)     Comment: Stated smokes weed every day.        HPI  Patient here for vaginal itching and STD screening with PMH dysmenorrhea, anxiety, depression.  Patient has had known exposure to gonorrhea and endorses unprotected sex with active infection.  She is unsure if she has vaginal discharge however does endorse having some vaginal itching that began 1 week ago.  Denies nausea vomiting or abdominal pain.      Chief Complaint   Patient presents with    Sexually Transmitted Diseases     To be checked for STD's.    Injections     For Depo injection.       Current Outpatient Medications on File Prior to Visit   Medication Sig Dispense Refill    medroxyPROGESTERone (DEPO-PROVERA) 150 MG/ML injection Inject 1 mL into the muscle every 3 months 1 mL 3    sertraline (ZOLOFT) 25 MG tablet Take 1 tablet by mouth daily       No current facility-administered medications on file prior to visit.        Current Outpatient Medications   Medication Sig Dispense Refill    fluconazole (DIFLUCAN) 150 MG tablet Take 1 tablet by mouth once for 1 dose 1 tablet 0    medroxyPROGESTERone (DEPO-PROVERA) 150 MG/ML injection Inject 1  mL into the muscle every 3 months 1 mL 3    sertraline (ZOLOFT) 25 MG tablet Take 1 tablet by mouth daily       Current Facility-Administered Medications   Medication Dose Route Frequency Provider Last Rate Last Admin    medroxyPROGESTERone (DEPO-PROVERA) injection 150 mg  150 mg IntraMUSCular Once Ivar Bury, APRN - CNP              Review of Systems   Constitutional: Negative.    HENT: Negative.     Eyes: Negative.    Respiratory: Negative.     Cardiovascular: Negative.    Gastrointestinal: Negative.    Endocrine: Negative.    Genitourinary:         Vaginal itching   Musculoskeletal: Negative.    Skin: Negative.    Neurological: Negative.    Hematological: Negative.    Psychiatric/Behavioral: Negative.  BP 104/78 (Site: Left Upper Arm, Position: Sitting, Cuff Size: Large Adult)   Pulse 72   Temp 98.2 F (36.8 C) (Oral)   Resp 20   Ht 1.702 m ( )   Wt 90.3 kg (199 lb)   SpO2 98%   BMI 31.17 kg/m     Physical Exam  Constitutional:       Appearance: Normal appearance. She is obese.   HENT:      Head: Normocephalic and atraumatic.   Pulmonary:      Effort: Pulmonary effort is normal.   Genitourinary:     General: Normal vulva.      Vagina: Vaginal discharge present.      Rectum: Normal.      Comments: Thick white vaginal discharge  Skin:     General: Skin is warm and dry.   Neurological:      General: No focal deficit present.      Mental Status: She is alert and oriented to person, place, and time.   Psychiatric:         Mood and Affect: Mood normal.         Behavior: Behavior normal.         Thought Content: Thought content normal.         Judgment: Judgment normal.          1. Abnormal uterine bleeding  -     AMB POC URINE PREGNANCY TEST, VISUAL COLOR COMPARISON  -     medroxyPROGESTERone (DEPO-PROVERA) injection 150 mg; 150 mg, IntraMUSCular, ONCE, 1 dose, On Fri 05/21/22 at 1200  Results for orders placed or performed in visit on 05/21/22   AMB POC URINE PREGNANCY TEST, VISUAL COLOR  COMPARISON   Result Value Ref Range    Valid Internal Control, POC yes     HCG, Pregnancy, Urine, POC Negative      Urine pregnancy is negative.  Depo IM administered in office.    2. STD exposure  -     cefTRIAXone (ROCEPHIN) injection 1,000 mg; 1,000 mg, IntraMUSCular, ONCE, 1 dose, On Fri 05/21/22 at 1200Antimicrobial Indications: STD infectionReconstitute with 3.6 mL 1% lidocaine or sterile water.  -     NuSwab Vaginitis Plus (VG+) with Candida (Six Species)  Rocephin 1gm IM administered for known exposure to Gonorrhea.  Nuswab obtained with  minimal discomfort reported by patient.  Moderate amount of thick white vaginal discharge consistent with yeast noted.  Will send single dose of diflucan now. Take as directed.    3. Encounter for gynecological examination with abnormal finding  Vaginal exam performed via speculum with minimal discomfort reported by patient.   Negative adnexal tenderness with bimanual exam.    4. Encounter for sexual health education  Discussed extensively importance of safe sex practices to include condom use.    5.  Vaginal yeast infection  Single dose Diflucan sent to pharmacy.  Take as directed.  Notify provider if no improvement in 2 to 3 days.       We discussed the expected course, resolution and complications of the diagnosis(es) in detail.  Medication risks, benefits, costs, interactions, and alternatives were discussed as indicated.  I advised her to contact the office if her condition worsens, changes or fails to improve as anticipated. She expressed understanding with the diagnosis(es) and plan.     Patient verbalizes understanding of plan of care as discussed above    Return if symptoms worsen or fail to improve.

## 2022-05-26 LAB — NUSWAB VAGINITIS PLUS (VG+) WITH CANDIDA (SIX SPECIES)
Candida Lusitaniae, NAA: NEGATIVE
Candida Parapsilosis/Tropicalis: NEGATIVE
Candida albicans, NAA: POSITIVE — AB
Candida glabrata: NEGATIVE
Candida krusei: NEGATIVE
Chlamydia trachomatis, NAA: NEGATIVE
Neisseria Gonorrhoeae, NAA: NEGATIVE
Trichomonas Vaginalis by NAA: NEGATIVE

## 2022-06-14 IMAGING — DX DG CHEST 1V PORT
1 series · 1 of 1 positions shown · non-contrast
Comparison: None.

CLINICAL DATA: Chest pain for 3 days.

EXAM:
PORTABLE CHEST 1 VIEW

[chest ap]
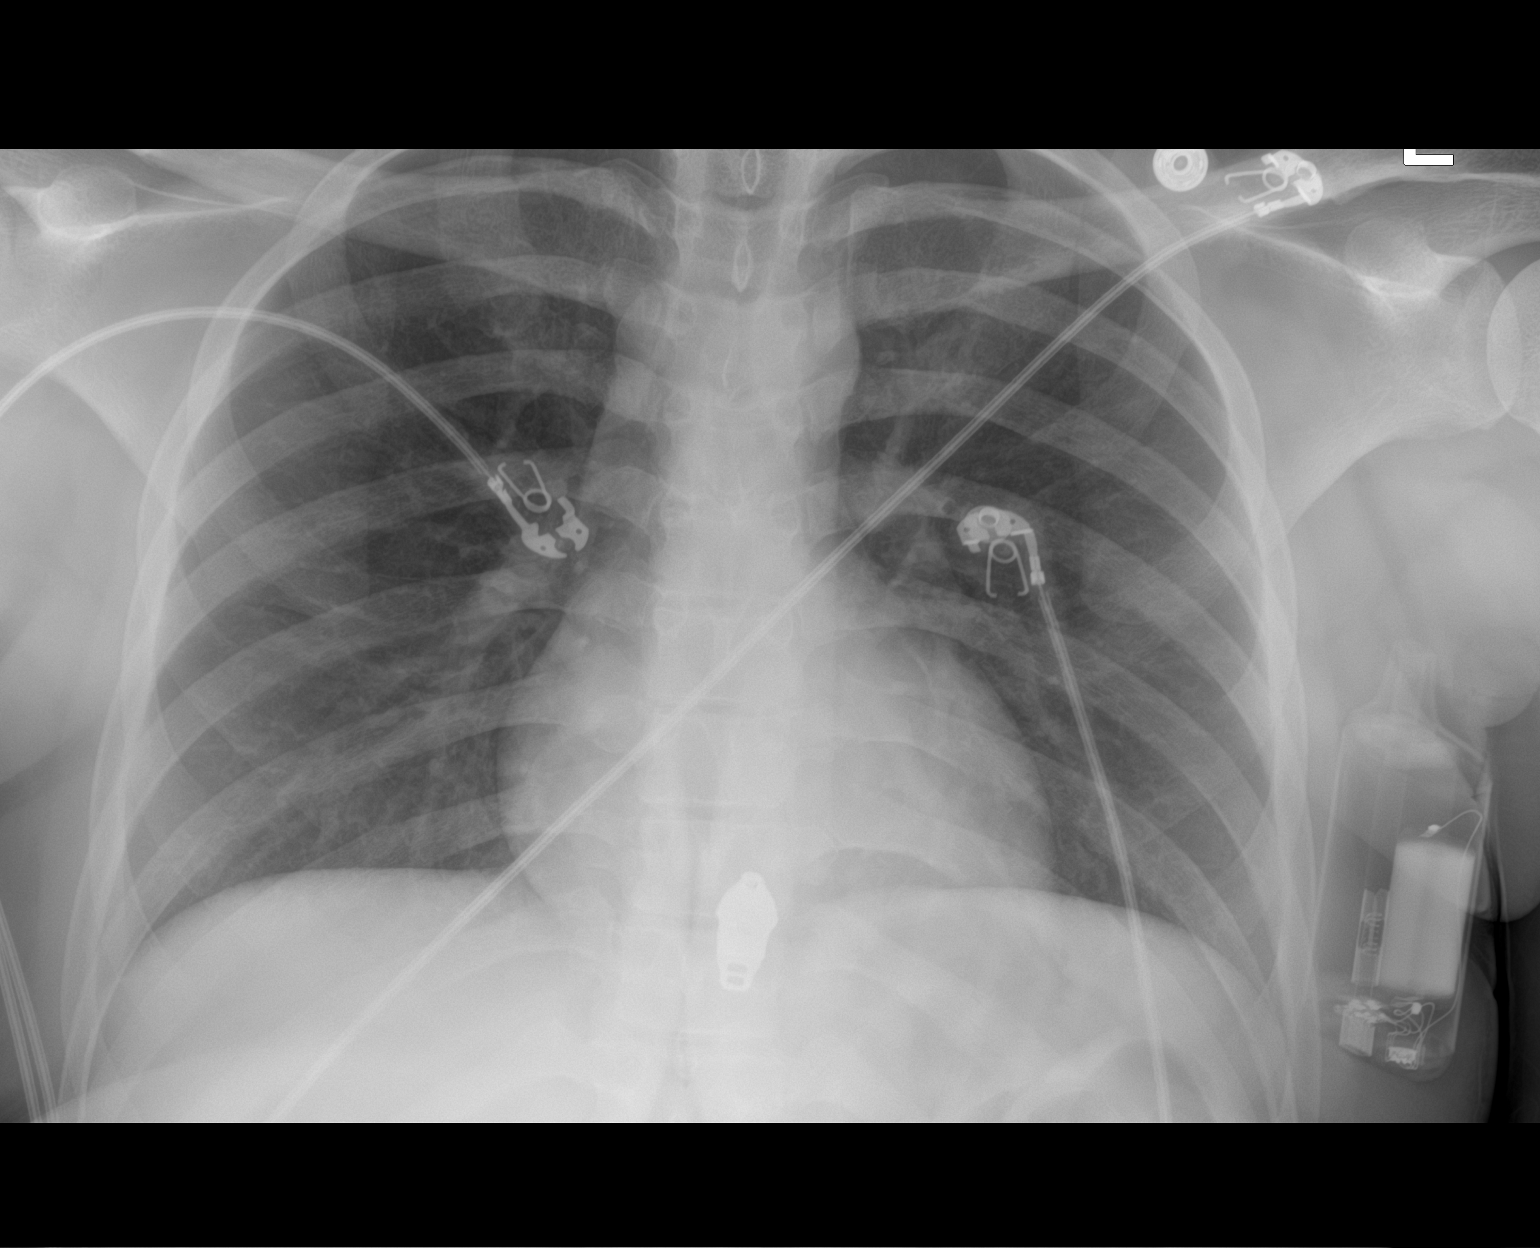

[1 of 1 positions shown; findings below may reference images not displayed]

FINDINGS: Lungs clear. Heart size normal. No pneumothorax or pleural fluid. No
bony abnormality.
IMPRESSION: Negative chest.

## 2022-08-18 ENCOUNTER — Ambulatory Visit: Admit: 2022-08-18 | Discharge: 2022-08-18 | Payer: MEDICAID

## 2022-08-18 DIAGNOSIS — F32A Depression, unspecified: Secondary | ICD-10-CM

## 2022-08-18 MED ORDER — SERTRALINE HCL 25 MG PO TABS
25 MG | ORAL_TABLET | Freq: Every day | ORAL | 2 refills | Status: DC
Start: 2022-08-18 — End: 2023-01-20

## 2022-08-18 NOTE — Progress Notes (Signed)
Chief Complaint   Patient presents with    Follow-up     To be checked for STD's.    Medication Refill     States has been out of Sertraline for a while and needs it.     "Have you been to the ER, urgent care clinic since your last visit?  Hospitalized since your last visit?"    NO    "Have you seen or consulted any other health care providers outside of [facility] since your last visit?"    NO    BP 112/73 (Site: Left Upper Arm, Position: Sitting, Cuff Size: Medium Adult)   Pulse 86   Temp 98.6 F (37 C) (Oral)   Resp 20   Ht 1.702 m (5\' 7" )   Wt 90.4 kg (199 lb 4 oz)   SpO2 98%   BMI 31.21 kg/m              Click Here for Release of Records Request

## 2022-08-18 NOTE — Progress Notes (Signed)
Diana Payne is a 22 y.o. female who presents to the office today for the following:    Chief Complaint   Patient presents with    Follow-up     To be checked for STD's.    Medication Refill     States has been out of Sertraline for a while and needs it.       Past Medical History:   Diagnosis Date    Anxiety     Depression     Substance abuse (HCC)         History reviewed. No pertinent surgical history.     History reviewed. No pertinent family history.     Social History     Tobacco Use    Smoking status: Some Days     Current packs/day: 0.25     Types: Cigarettes    Smokeless tobacco: Never   Vaping Use    Vaping Use: Every day    Substances: THC    Devices: Disposable   Substance Use Topics    Alcohol use: Yes     Alcohol/week: 3.0 standard drinks of alcohol     Types: 3 Shots of liquor per week     Comment: Occassional    Drug use: Yes     Frequency: 7.0 times per week     Types: Marijuana Sheran Fava)     Comment: Stated smokes weed sometimes.        HPI  Patient here for STD screenings with no known exposure. Does endorse regular casual sexual activity. Denies vaginal symptoms.    While here patient also states that her anxiety and depression has been bothering her for a few weeks since she ran out of Zoloft. States she did not have ride to pharmacy therefore stopped Zoloft approx 6-7 weeks ago. Denies SI HI AVH. Does endorse low mood and feeling anxious.    Chief Complaint   Patient presents with    Follow-up     To be checked for STD's.    Medication Refill     States has been out of Sertraline for a while and needs it.       Current Outpatient Medications on File Prior to Visit   Medication Sig Dispense Refill    medroxyPROGESTERone (DEPO-PROVERA) 150 MG/ML injection Inject 1 mL into the muscle every 3 months 1 mL 3     No current facility-administered medications on file prior to visit.        Current Outpatient Medications   Medication Sig Dispense Refill    sertraline (ZOLOFT) 25 MG tablet Take 1  tablet by mouth daily 30 tablet 2    medroxyPROGESTERone (DEPO-PROVERA) 150 MG/ML injection Inject 1 mL into the muscle every 3 months 1 mL 3     No current facility-administered medications for this visit.          Review of Systems   Constitutional: Negative.    HENT: Negative.     Respiratory: Negative.     Cardiovascular: Negative.    Gastrointestinal: Negative.    Genitourinary: Negative.    Musculoskeletal: Negative.    Skin: Negative.    Neurological: Negative.    Psychiatric/Behavioral:  The patient is nervous/anxious.         Depression         BP 112/73 (Site: Left Upper Arm, Position: Sitting, Cuff Size: Medium Adult)   Pulse 86   Temp 98.6 F (37 C) (Oral)   Resp 20   Ht 1.702  m (5\' 7" )   Wt 90.4 kg (199 lb 4 oz)   SpO2 98%   BMI 31.21 kg/m     Last Point of Care HGB A1C  No results found for: "HBA1CPOC"      Physical Exam  Constitutional:       Appearance: Normal appearance.   HENT:      Head: Normocephalic and atraumatic.   Pulmonary:      Effort: Pulmonary effort is normal.   Genitourinary:     General: Normal vulva.      Vagina: Vaginal discharge present.      Rectum: Normal.      Comments: Small amount of thick white vaginal discharge in vaginal canal  Skin:     General: Skin is warm and dry.   Neurological:      General: No focal deficit present.      Mental Status: She is alert and oriented to person, place, and time.   Psychiatric:         Mood and Affect: Mood normal.         Behavior: Behavior normal.         Thought Content: Thought content normal.         Judgment: Judgment normal.      Comments: Calm cooperative quite answers questions          1. Anxiety and depression  -     sertraline (ZOLOFT) 25 MG tablet; Take 1 tablet by mouth daily, Disp-30 tablet, R-2Normal  This is not well controlled.   Will refill Zoloft now. Take as directed. Can take up to 6 weeks for effectiveness.  Educated patient that this medication can cause SI HI and AVH. If SI HI or AVH contact provider  immediately or seek ER care.  Discussed adjuvant therapy to include meditation, massage, and deep breathing exercises.  Patient declines referral to psychiatry.   4 week follow up.    2. Encntr for gyn exam (general) (routine) w/o abn findings       Vaginal exam performed via speculum with minimal discomfort reported by patient.       No adnexal tenderness during bimanual exam.     3. Screen for STD (sexually transmitted disease)  -     HIV 1/2 Ag/Ab, 4TH Generation,W Rflx Confirm  -     Hepatitis Panel, Acute  -     T. pallidum Ab  -     NuSwab Vaginitis Plus (VG+) with Candida (Six Species)  Nuswab obtained with minimal discomfort reported by patient. Will await results.  Labs ordered for screening.  Discussed safe sex practices to include condom use.       We discussed the expected course, resolution and complications of the diagnosis(es) in detail.  Medication risks, benefits, costs, interactions, and alternatives were discussed as indicated.  I advised her to contact the office if her condition worsens, changes or fails to improve as anticipated. She expressed understanding with the diagnosis(es) and plan.     Patient verbalizes understanding of plan of care as discussed above    Return in about 4 weeks (around 09/15/2022), or if symptoms worsen or fail to improve, for adhd/anxiety fu.

## 2022-08-19 LAB — HEPATITIS PANEL, ACUTE
Hep A IgM: NEGATIVE
Hep B Core Ab, IgM: NEGATIVE
Hepatitis B Surface Ag: NEGATIVE
Hepatitis C Ab: NONREACTIVE

## 2022-08-19 LAB — HIV 1/2 AG/AB, 4TH GENERATION,W RFLX CONFIRM: HIV Screen 4th Generation wRfx: NONREACTIVE

## 2022-08-19 LAB — INTERPRETATION

## 2022-08-20 LAB — T. PALLIDUM AB: T Pallidun Ab: NONREACTIVE

## 2022-08-22 LAB — NUSWAB VAGINITIS PLUS (VG+) WITH CANDIDA (SIX SPECIES)
Candida Lusitaniae, NAA: NEGATIVE
Candida Parapsilosis/Tropicalis: NEGATIVE
Candida albicans, NAA: NEGATIVE
Candida glabrata: NEGATIVE
Candida krusei: NEGATIVE
Chlamydia trachomatis, NAA: NEGATIVE
Neisseria Gonorrhoeae, NAA: NEGATIVE
Trichomonas Vaginalis by NAA: NEGATIVE

## 2022-10-27 ENCOUNTER — Encounter

## 2022-10-27 ENCOUNTER — Ambulatory Visit: Admit: 2022-10-27 | Discharge: 2022-10-27 | Payer: MEDICAID

## 2022-10-27 VITALS — BP 105/71 | HR 74 | Temp 98.50000°F | Resp 20 | Ht 67.0 in | Wt 202.1 lb

## 2022-10-27 DIAGNOSIS — Z113 Encounter for screening for infections with a predominantly sexual mode of transmission: Secondary | ICD-10-CM

## 2022-10-27 MED ORDER — FLUCONAZOLE 150 MG PO TABS
150 | ORAL_TABLET | Freq: Once | ORAL | 0 refills | Status: AC
Start: 2022-10-27 — End: 2022-10-27

## 2022-10-27 MED ORDER — MEDROXYPROGESTERONE ACETATE 150 MG/ML IM SUSY
150 | INTRAMUSCULAR | 0 refills | Status: DC
Start: 2022-10-27 — End: 2022-10-31

## 2022-10-27 NOTE — Progress Notes (Signed)
 Diana Payne is a 22 y.o. female who presents to the office today for the following:    Chief Complaint   Patient presents with    Sexually Transmitted Diseases     Wants to be tested for STD's.       Past Medical History:   Diagnosis Date    Anxiety     Depression     Substance abuse (HCC)         History reviewed. No pertinent surgical history.     History reviewed. No pertinent family history.     Social History     Tobacco Use    Smoking status: Some Days     Current packs/day: 0.25     Types: Cigarettes    Smokeless tobacco: Never   Vaping Use    Vaping status: Every Day    Substances: THC    Devices: Disposable   Substance Use Topics    Alcohol use: Yes     Alcohol/week: 3.0 standard drinks of alcohol     Types: 3 Shots of liquor per week     Comment: Occassional    Drug use: Yes     Frequency: 7.0 times per week     Types: Marijuana Oda)     Comment: Stated smokes weed sometimes.        HPI  Patient here for STD testing and vaginal exam. Reports vaginal itching and burning. Participates in risky sexual behavior. Denies known exposure.     Chief Complaint   Patient presents with    Sexually Transmitted Diseases     Wants to be tested for STD's.       Current Outpatient Medications on File Prior to Visit   Medication Sig Dispense Refill    sertraline  (ZOLOFT ) 25 MG tablet Take 1 tablet by mouth daily 30 tablet 2    medroxyPROGESTERone  (DEPO-PROVERA ) 150 MG/ML injection Inject 1 mL into the muscle every 3 months 1 mL 3     No current facility-administered medications on file prior to visit.        Current Outpatient Medications   Medication Sig Dispense Refill    fluconazole  (DIFLUCAN ) 150 MG tablet Take 1 tablet by mouth once for 1 dose 1 tablet 0    sertraline  (ZOLOFT ) 25 MG tablet Take 1 tablet by mouth daily 30 tablet 2    medroxyPROGESTERone  (DEPO-PROVERA ) 150 MG/ML injection Inject 1 mL into the muscle every 3 months 1 mL 3     No current facility-administered medications for this visit.           Review of Systems   Constitutional: Negative.    HENT: Negative.     Respiratory: Negative.     Cardiovascular: Negative.    Gastrointestinal: Negative.    Genitourinary:         Vaginal itching and burning   Musculoskeletal: Negative.    Skin: Negative.    Neurological: Negative.    Psychiatric/Behavioral: Negative.           BP 105/71 (Site: Left Upper Arm, Position: Sitting, Cuff Size: Medium Adult)   Pulse 74   Temp 98.5 F (36.9 C) (Oral)   Resp 20   Ht 1.702 m (5' 7)   Wt 91.7 kg (202 lb 2 oz)   SpO2 98%   BMI 31.66 kg/m     Last Point of Care HGB A1C  No results found for: HBA1CPOC      Physical Exam  Constitutional:  Appearance: Normal appearance. She is obese.   Pulmonary:      Effort: Pulmonary effort is normal.   Genitourinary:     Vagina: Vaginal discharge present.      Rectum: Normal.      Comments: Thick clumpy white colored discharge, moderate amount  Skin:     General: Skin is warm and dry.   Neurological:      Mental Status: She is alert and oriented to person, place, and time.   Psychiatric:         Mood and Affect: Mood normal.         Behavior: Behavior normal.         Thought Content: Thought content normal.         Judgment: Judgment normal.          1. Screen for STD (sexually transmitted disease)  -     NuSwab Vaginitis Plus (VG+) with Candida (Six Species)  Nuswab obtained with minimal discomfort reported by patient.  Will await results now.  Discussed safe sex practices to include condom use.    2. Encntr for gyn exam (general) (routine) w abnormal findings       Vaginal exam performed via speculum with minimal discomfort reported by patient.       No adnexal tenderness during bimanual exam.     3. Vaginal yeast infection  -     fluconazole  (DIFLUCAN ) 150 MG tablet; Take 1 tablet by mouth once for 1 dose, Disp-1 tablet, R-0Normal  Moderate yeast on exam - treat with Diflucan  now. Take as directed.  Notify provider if no improvement of symptoms in 2-3 days.        We  discussed the expected course, resolution and complications of the diagnosis(es) in detail.  Medication risks, benefits, costs, interactions, and alternatives were discussed as indicated.  I advised her to contact the office if her condition worsens, changes or fails to improve as anticipated. She expressed understanding with the diagnosis(es) and plan.     Patient verbalizes understanding of plan of care as discussed above    Return if symptoms worsen or fail to improve.

## 2022-10-27 NOTE — Progress Notes (Signed)
 Chief Complaint   Patient presents with    Sexually Transmitted Diseases     Wants to be tested for STD's.     Have you been to the ER, urgent care clinic since your last visit?  Hospitalized since your last visit?    NO    "Have you seen or consulted any other health care providers outside our system since your last visit?"    NO    BP 105/71 (Site: Left Upper Arm, Position: Sitting, Cuff Size: Medium Adult)   Pulse 74   Temp 98.5 F (36.9 C) (Oral)   Resp 20   Ht 1.702 m (5' 7)   Wt 91.7 kg (202 lb 2 oz)   SpO2 98%   BMI 31.66 kg/m          Click Here for Release of Records Request

## 2022-10-31 ENCOUNTER — Encounter

## 2022-11-01 MED ORDER — MEDROXYPROGESTERONE ACETATE 150 MG/ML IM SUSY
150 | INTRAMUSCULAR | 0 refills | Status: DC
Start: 2022-11-01 — End: 2022-12-26

## 2022-11-02 LAB — NUSWAB VAGINITIS PLUS (VG+) WITH CANDIDA (SIX SPECIES)
Candida Lusitaniae, NAA: NEGATIVE
Candida Parapsilosis/Tropicalis: NEGATIVE
Candida albicans, NAA: POSITIVE — AB
Candida glabrata: NEGATIVE
Candida krusei: NEGATIVE
Chlamydia trachomatis, NAA: NEGATIVE
Neisseria Gonorrhoeae, NAA: NEGATIVE
Trichomonas Vaginalis by NAA: NEGATIVE

## 2022-11-29 ENCOUNTER — Encounter

## 2022-11-29 MED ORDER — FLUCONAZOLE 150 MG PO TABS
150 | ORAL_TABLET | Freq: Once | ORAL | 0 refills | Status: AC
Start: 2022-11-29 — End: 2022-11-29

## 2022-11-29 NOTE — Progress Notes (Signed)
Recent dx with yeast. Will retreat due to itching and burning. If no improvement advised needs office visit.

## 2022-11-29 NOTE — Telephone Encounter (Signed)
Unable to reach patient.  Did not answer phone and mail box is not set up. Sent message to patient thru My Chart.

## 2022-12-06 ENCOUNTER — Encounter

## 2022-12-26 ENCOUNTER — Encounter

## 2022-12-26 NOTE — Telephone Encounter (Signed)
Depo refill sent

## 2022-12-27 MED ORDER — MEDROXYPROGESTERONE ACETATE 150 MG/ML IM SUSY
150 | INTRAMUSCULAR | 2 refills | 90.00000 days | Status: DC
Start: 2022-12-27 — End: 2023-08-01

## 2023-01-20 ENCOUNTER — Ambulatory Visit: Admit: 2023-01-20 | Discharge: 2023-01-20 | Payer: MEDICAID

## 2023-01-20 VITALS — BP 121/78 | HR 73 | Temp 98.40000°F | Resp 18 | Ht 67.0 in | Wt 198.0 lb

## 2023-01-20 DIAGNOSIS — Z113 Encounter for screening for infections with a predominantly sexual mode of transmission: Secondary | ICD-10-CM

## 2023-01-20 MED ORDER — FLUCONAZOLE 150 MG PO TABS
150 | ORAL_TABLET | ORAL | 0 refills | 6.00000 days | Status: DC
Start: 2023-01-20 — End: 2023-02-27

## 2023-01-20 MED ORDER — SERTRALINE HCL 25 MG PO TABS
25 | ORAL_TABLET | Freq: Every day | ORAL | 2 refills | Status: DC
Start: 2023-01-20 — End: 2023-10-25

## 2023-01-20 NOTE — Progress Notes (Signed)
 Chief Complaint   Patient presents with    Sexually Transmitted Diseases     Just wants a screening no symptoms        "Have you been to the ER, urgent care clinic since your last visit?  Hospitalized since your last visit?"    no    "Have you seen or cons

## 2023-01-20 NOTE — Progress Notes (Signed)
 Diana Payne is a 22 y.o. female who presents to the office today for the following:    Chief Complaint   Patient presents with    Sexually Transmitted Diseases       Past Medical History:   Diagnosis Date    Anxiety     Depression     Substance abuse (HCC)         History reviewed. No pertinent surgical history.     History reviewed. No pertinent family history.     Social History     Tobacco Use    Smoking status: Some Days     Current packs/day: 0.25     Types: Cigarettes    Smokeless tobacco: Never   Vaping Use    Vaping status: Every Day    Substances: THC    Devices: Disposable   Substance Use Topics    Alcohol use: Yes     Alcohol/week: 3.0 standard drinks of alcohol     Types: 3 Shots of liquor per week     Comment: Occassional    Drug use: Yes     Frequency: 7.0 times per week     Types: Marijuana Oda)     Comment: Stated smokes weed sometimes.        HPI  Patient here for  for STD testing and vaginal exam. Reports vaginal itching and burning. Participates in risky sexual behavior. Denies known exposure. Takes Depo as directed. While here depression is stable. Denies SI HI AVH. Needs refills.    Chief Complaint   Patient presents with    Sexually Transmitted Diseases       Current Outpatient Medications on File Prior to Visit   Medication Sig Dispense Refill    medroxyPROGESTERone  (DEPO-PROVERA ) 150 MG/ML injection Inject 150 mg into the muscle every 3 months 1 mL 2     No current facility-administered medications on file prior to visit.        Current Outpatient Medications   Medication Sig Dispense Refill    fluconazole  (DIFLUCAN ) 150 MG tablet Take 1 tablet by mouth every 72 hours for 6 days 2 tablet 0    sertraline  (ZOLOFT ) 25 MG tablet Take 1 tablet by mouth daily 30 tablet 2    medroxyPROGESTERone  (DEPO-PROVERA ) 150 MG/ML injection Inject 150 mg into the muscle every 3 months 1 mL 2     No current facility-administered medications for this visit.          Review of Systems   Constitutional:  Negative.    HENT: Negative.     Respiratory: Negative.     Cardiovascular: Negative.    Gastrointestinal: Negative.    Genitourinary:         Itching   Musculoskeletal: Negative.    Skin: Negative.    Neurological: Negative.    Psychiatric/Behavioral: Negative.           BP 121/78 (Site: Left Upper Arm, Position: Sitting, Cuff Size: Medium Adult)   Pulse 73   Temp 98.4 F (36.9 C) (Oral)   Resp 18   Ht 1.702 m (5' 7)   Wt 89.8 kg (198 lb)   SpO2 98%   BMI 31.01 kg/m     Hemoglobin A1C   Date Value Ref Range Status   12/03/2021 5.2 4.8 - 5.6 % Final     Comment:              Prediabetes: 5.7 - 6.4  Diabetes: >6.4           Glycemic control for adults with diabetes: <7.0         Physical Exam  Exam conducted with a chaperone present.   Constitutional:       Appearance: Normal appearance. She is obese.   HENT:      Head: Normocephalic and atraumatic.   Pulmonary:      Effort: Pulmonary effort is normal.   Abdominal:      Hernia: There is no hernia in the left inguinal area or right inguinal area.   Genitourinary:     General: Normal vulva.      Exam position: Lithotomy position.      Pubic Area: No rash or pubic lice.       Labia:         Right: No rash, tenderness, lesion or injury.         Left: No rash, tenderness, lesion or injury.       Urethra: No prolapse, urethral pain, urethral swelling or urethral lesion.      Vagina: Vaginal discharge present.      Cervix: Normal.      Uterus: Normal.       Adnexa: Right adnexa normal and left adnexa normal.      Rectum: Normal.   Lymphadenopathy:      Lower Body: No right inguinal adenopathy. No left inguinal adenopathy.   Skin:     General: Skin is warm and dry.   Neurological:      General: No focal deficit present.      Mental Status: She is alert and oriented to person, place, and time.   Psychiatric:         Mood and Affect: Mood normal.         Behavior: Behavior normal.         Thought Content: Thought content normal.         Judgment: Judgment  normal.          1. Screen for STD (sexually transmitted disease)  -     NuSwab Vaginitis Plus (VG+) with Candida (Six Species)  Nuswab obtained with minimal discomfort reported by patient. Will await results.  Discussed safe sex practices to include condom use.    2. Encntr for gyn exam (general) (routine) w abnormal findings       Vaginal exam performed via speculum with minimal discomfort reported by patient.       No adnexal tenderness during bimanual exam.     3. Yeast vaginitis  -     fluconazole  (DIFLUCAN ) 150 MG tablet; Take 1 tablet by mouth every 72 hours for 6 days, Disp-2 tablet, R-0Normal  Moderate yeast on exam - treat with Diflucan  now. Take as directed.  Notify provider if no improvement of symptoms in 2-3 days.     4. Dysmenorrhea       Stable. Continue Depo as directed.    5. Anxiety and depression  -     sertraline  (ZOLOFT ) 25 MG tablet; Take 1 tablet by mouth daily, Disp-30 tablet, R-2Normal  Stable.  Continue Zoloft  50mg  daily as directed.  Denies SI HI AVH. Should this occur seek ER care.  Discussed adjuvant therapy to include meditation, massage, and deep breathing exercises.  Patient declines referral to psychiatry or therapy.        We discussed the expected course, resolution and complications of the diagnosis(es) in detail.  Medication risks, benefits, costs, interactions, and alternatives  were discussed as indicated.  I advised her to contact the office if her condition worsens, changes or fails to improve as anticipated. She expressed understanding with the diagnosis(es) and plan.     Patient verbalizes understanding of plan of care as discussed above    Return if symptoms worsen or fail to improve.     Please note that portions of this note was potentially completed with Dragon dictation, the computer voice recognition software.  Quite often unanticipated grammatical, syntax, homophones, and other interpretive errors are inadvertently transcribed by the computer software.  Please  disregard these errors.  Please excuse any errors that have escaped final proofreading.  Thank you.

## 2023-01-23 LAB — NUSWAB VAGINITIS PLUS (VG+) WITH CANDIDA (SIX SPECIES)
Candida Lusitaniae, NAA: NEGATIVE
Candida Parapsilosis/Tropicalis: NEGATIVE
Candida albicans, NAA: NEGATIVE
Candida glabrata: NEGATIVE
Candida krusei: NEGATIVE
Chlamydia trachomatis, NAA: NEGATIVE
Neisseria Gonorrhoeae, NAA: NEGATIVE
Trichomonas Vaginalis by NAA: NEGATIVE

## 2023-02-27 ENCOUNTER — Encounter

## 2023-02-27 NOTE — Telephone Encounter (Signed)
Recurrent yeast reported. Will send single tab of diflucan now. Take as directed.

## 2023-02-28 MED ORDER — FLUCONAZOLE 150 MG PO TABS
150 | ORAL_TABLET | ORAL | 0 refills | Status: AC
Start: 2023-02-28 — End: 2023-03-05

## 2023-07-31 ENCOUNTER — Encounter

## 2023-08-01 MED ORDER — MEDROXYPROGESTERONE ACETATE 150 MG/ML IM SUSY
150 | INTRAMUSCULAR | 0 refills | 84.00000 days | Status: DC
Start: 2023-08-01 — End: 2023-11-21

## 2023-10-18 ENCOUNTER — Encounter

## 2023-10-21 ENCOUNTER — Encounter

## 2023-10-25 ENCOUNTER — Inpatient Hospital Stay: Admit: 2023-10-25 | Payer: Medicaid (Managed Care)

## 2023-10-25 ENCOUNTER — Ambulatory Visit: Admit: 2023-10-25 | Discharge: 2023-10-25 | Payer: Medicaid (Managed Care)

## 2023-10-25 MED ORDER — SERTRALINE HCL 25 MG PO TABS
25 | ORAL_TABLET | Freq: Every day | ORAL | 1 refills | Status: AC
Start: 2023-10-25 — End: ?

## 2023-10-25 NOTE — Progress Notes (Signed)
 Chief Complaint   Patient presents with    screen for STDs    Depression     Follow up     Have you been to the ER, urgent care clinic since your last visit?  Hospitalized since your last visit?   NO    Have you seen or consulted any other health care providers outside our system since your last visit?   NO

## 2023-10-25 NOTE — Progress Notes (Signed)
 23 y/o female.  Established patient, known to [facility].  New to me.  Here for STI screening, both urine and blood.  Not sexually active.  Same-sex partner.  Asymptomatic.  Refused examination.

## 2023-10-26 LAB — HEPATITIS C ANTIBODY: Hepatitis C Ab: NONREACTIVE

## 2023-10-26 LAB — HEPATITIS B SURFACE ANTIGEN: Hepatitis B Surface Ag: NONREACTIVE

## 2023-10-26 LAB — T. PALLIDUM AB: Syphillis T. Palladium Ab w/Reflex to RPR: NONREACTIVE

## 2023-10-26 LAB — HEPATITIS B SURFACE ANTIBODY: Hep B S Ab: 4.55 m[IU]/mL

## 2023-10-27 LAB — C.TRACHOMATIS N.GONORRHOEAE DNA, URINE
CHLAMYDIA AMPLIFIED: NEGATIVE
NEISSERIA GONORRHOEAE BY AMP: NEGATIVE

## 2023-10-27 LAB — HIV-1/2 RNA QL
HIV 1 RNA, Ql: NONREACTIVE
HIV-2 RNA Qualitative: NONREACTIVE

## 2023-11-13 ENCOUNTER — Encounter

## 2023-11-17 NOTE — Telephone Encounter (Signed)
"  Patient is requesting a call in ref to a medication.  "

## 2023-11-21 ENCOUNTER — Encounter

## 2023-11-28 MED ORDER — MEDROXYPROGESTERONE ACETATE 150 MG/ML IM SUSY
150 | INTRAMUSCULAR | 0 refills | 84.00000 days | Status: AC
Start: 2023-11-28 — End: ?

## 2023-12-13 ENCOUNTER — Inpatient Hospital Stay
Admit: 2023-12-13 | Discharge: 2023-12-13 | Disposition: A | Discharge status: Home / Self Care | Payer: Medicaid (Managed Care) | Arrived: AM

## 2023-12-13 DIAGNOSIS — R0789 Other chest pain: Secondary | ICD-10-CM

## 2023-12-13 LAB — COMPREHENSIVE METABOLIC PANEL
ALT: 8 U/L — ABNORMAL LOW (ref 10–35)
AST: 14 U/L (ref 10–38)
Albumin/Globulin Ratio: 1
Albumin: 3.7 g/dL (ref 3.4–5.0)
Alk Phosphatase: 57 U/L (ref 45–117)
Anion Gap: 9 mmol/L (ref 3–18)
BUN/Creatinine Ratio: 9
BUN: 11 mg/dL (ref 6–23)
CO2: 28 mmol/L (ref 21–32)
Calcium: 9.5 mg/dL (ref 8.6–10.2)
Chloride: 102 mmol/L (ref 98–107)
Creatinine: 1.19 mg/dL (ref 0.60–1.30)
Est, Glom Filt Rate: 65 ml/min/1.73m2
Globulin: 3.8 g/dL
Glucose: 85 mg/dL (ref 74–108)
Potassium: 4.1 mmol/L (ref 3.5–5.5)
Sodium: 138 mmol/L (ref 136–145)
Total Bilirubin: 0.4 mg/dL (ref 0.2–1.0)
Total Protein: 7.5 g/dL (ref 6.4–8.2)

## 2023-12-13 LAB — CBC WITH AUTO DIFFERENTIAL
Basophils %: 1.1 % (ref 0.0–2.0)
Basophils Absolute: 0.08 K/UL (ref 0.00–0.10)
Eosinophils %: 2.9 % (ref 0.0–5.0)
Eosinophils Absolute: 0.21 K/UL (ref 0.00–0.40)
Hematocrit: 40.4 % (ref 35.0–45.0)
Hemoglobin: 13.7 g/dL (ref 12.0–16.0)
Immature Granulocytes %: 0.1 % (ref 0–0.5)
Immature Granulocytes Absolute: 0.01 K/UL (ref 0.00–0.04)
Lymphocytes %: 26.3 % (ref 21.0–52.0)
Lymphocytes Absolute: 1.91 K/UL (ref 0.90–3.60)
MCH: 29.6 pg (ref 24.0–34.0)
MCHC: 33.9 g/dL (ref 31.0–37.0)
MCV: 87.3 FL (ref 78.0–100.0)
MPV: 9.2 FL (ref 9.2–11.8)
Monocytes %: 6.7 % (ref 3.0–10.0)
Monocytes Absolute: 0.49 K/UL (ref 0.05–1.20)
Neutrophils %: 62.9 % (ref 40.0–73.0)
Neutrophils Absolute: 4.56 K/UL (ref 1.80–8.00)
Nucleated RBCs: 0 /100{WBCs}
Platelets: 311 K/uL (ref 135–420)
RBC: 4.63 M/uL (ref 4.20–5.30)
RDW: 12.5 % (ref 11.6–14.5)
WBC: 7.3 K/uL (ref 4.6–13.2)
nRBC: 0 K/uL (ref 0.00–0.01)

## 2023-12-13 LAB — EKG 12-LEAD
Atrial Rate: 66 {beats}/min
Diagnosis: NORMAL
P Axis: 54 degrees
P-R Interval: 140 ms
Q-T Interval: 382 ms
QRS Duration: 84 ms
QTc Calculation (Bazett): 400 ms
R Axis: 47 degrees
T Axis: 28 degrees
Ventricular Rate: 66 {beats}/min

## 2023-12-13 LAB — COVID-19 & INFLUENZA COMBO
Rapid Influenza A By PCR: NOT DETECTED
Rapid Influenza B By PCR: NOT DETECTED
SARS-CoV-2, PCR: NOT DETECTED

## 2023-12-13 LAB — MAGNESIUM: Magnesium: 2.6 mg/dL (ref 1.6–2.6)

## 2023-12-13 LAB — LIPASE: Lipase: 43 U/L (ref 13–75)

## 2023-12-13 LAB — D-DIMER, QUANTITATIVE: D-Dimer, Quant: <0.27 ug{FEU}/mL (ref <0.46)

## 2023-12-13 LAB — HCG, SERUM, QUALITATIVE: Preg, Serum: NEGATIVE

## 2023-12-13 LAB — TROPONIN: Troponin T: <6 ng/L (ref 0–14)

## 2023-12-13 NOTE — ED Provider Notes (Signed)
 "[facility] EMERGENCY DEPT  EMERGENCY DEPARTMENT HISTORY AND PHYSICAL EXAM      Date of evaluation: 12/13/2023  Patient Name: Diana Payne  Birthdate 2001/01/25  MRN: [id number]  ED Provider: Ronal JINNY Birchwood, MD   Note Started: 2:35 PM EST 12/13/23    HISTORY OF PRESENT ILLNESS     Chief Complaint   Patient presents with    Chest Wall Pain       History Provided By: Patient    HPI: Kashana Breach is a 23 y.o. female complains of chest pain for the last 3 to 4 months.  Pain is located in her anterior chest, is intermittent, tightness in nature, duration of 15 to 20 minutes to hours at a time, perhaps associated with shortness of breath??,  Mild in severity, without radiation, no worsening or relieving factors.    Patient had her Depo-Provera  shot last week.    Denies fever, cough, passing out, diaphoresis, nausea, abdominal pain, trauma.    PAST MEDICAL HISTORY   Past Medical History:  Past Medical History:   Diagnosis Date    Anxiety     Depression     Substance abuse (HCC)        Past Surgical History:  History reviewed. No pertinent surgical history.    Family History:  History reviewed. No pertinent family history.    Social History:  Social History     Tobacco Use    Smoking status: Some Days     Current packs/day: 0.25     Types: Cigarettes    Smokeless tobacco: Never   Vaping Use    Vaping status: Every Day    Substances: THC    Devices: Disposable   Substance Use Topics    Alcohol use: Yes     Alcohol/week: 3.0 standard drinks of alcohol     Types: 3 Shots of liquor per week     Comment: Occassional    Drug use: Yes     Frequency: 7.0 times per week     Types: Marijuana Oda)     Comment: Stated smokes weed sometimes.       Allergies:  No Known Allergies    PCP: None, None    Current Meds:   No current facility-administered medications for this encounter.     Current Outpatient Medications   Medication Sig Dispense Refill    medroxyPROGESTERone  (DEPO-PROVERA ) 150 MG/ML injection Inject 150 mg into the muscle  every 3 months 1 mL 0    sertraline  (ZOLOFT ) 25 MG tablet Take 1 tablet by mouth daily 90 tablet 1       Social Determinants of Health:   Social Drivers of Health     Tobacco Use: High Risk (12/13/2023)    Patient History     Smoking Tobacco Use: Some Days     Smokeless Tobacco Use: Never     Passive Exposure: Not on file   Alcohol Use: Not At Risk (11/03/2021)    AUDIT-C     Frequency of Alcohol Consumption: 2-3 times a week     Average Number of Drinks: 1 or 2     Frequency of Binge Drinking: Never   Financial Resource Strain: Low Risk  (01/20/2023)    Overall Financial Resource Strain (CARDIA)     Difficulty of Paying Living Expenses: Not very hard   Food Insecurity: No Food Insecurity (10/25/2023)    Hunger Vital Sign     Worried About Running Out of Food in the Last Year:  Never true     Ran Out of Food in the Last Year: Never true   Transportation Needs: No Transportation Needs (10/25/2023)    PRAPARE - Therapist, Art (Medical): No     Lack of Transportation (Non-Medical): No   Physical Activity: Not on file   Stress: Not on file   Social Connections: Not on file   Intimate Partner Violence: Not on file   Depression: Not at risk (10/25/2023)    PHQ-2     PHQ-2 Score: 2   Housing Stability: Low Risk  (10/25/2023)    Housing Stability Vital Sign     Unable to Pay for Housing in the Last Year: No     Number of Times Moved in the Last Year: 0     Homeless in the Last Year: No   Interpersonal Safety: Not At Risk (12/13/2023)    Interpersonal Safety Domain Source: IP Abuse Screening     Physical abuse: Denies     Verbal abuse: Denies     Emotional abuse: Denies     Financial abuse: Denies     Sexual abuse: Denies   Utilities: Not At Risk (10/25/2023)    [facility] Utilities     Threatened with loss of utilities: No     PHYSICAL EXAM   Physical Exam    Gen:   No acute distress. Resting comfortably.  Head:   Normocephalic atraumatic.  Symmetrical.  Eyes:   Pupils equal and round.  Sclera white.   Conjunctiva clear.  Extraocular muscles grossly intact.  No nystagmus.  Nose: Normal appearance.  Clear.  Mouth:   Normal appearance.  Mucous membranes pink and moist.  Lips normal.  Oropharynx:   Clear.  Uvula raises midline.   Neck:   Normal appearance.  Trachea midline.  No accessory muscle movement.  No masses.  Chest:   Normal appearance.  Bilateral equal and clear breath sounds without distress.  No accessory muscle movement.  Speaks normally in sentences.  Chest wall tenderness in the substernal area consistent with the type of pain the patient is complaining of.  CVS:   Normal S1, S2.  No S3, S4.  No murmurs gallops.  Color good.  Radial pulses equal and strong.   Abdomen:   Positive bowel sounds.  Nontender to deep palpation.  No organomegaly.  Normal appearance.  Back:  Moves normally.  Nontender over posterior spine.  No CVA tenderness.  Extremities:   Moves all extremities normally.  Grossly nontender.  Legs: No posterior tib-fib tenderness.  No Homans' sign.  Strong pulses.  Skin:   Good color.  No diaphoresis.  No lesions.  GU:   Not examined.   Neuro:   Alert and oriented x 4.  Grossly nonfocal.  Cranial nerves grossly intact.  Coordination precise.  Speech clear.   Psych:   Normal affect.  Normal mood.  Cooperative.             SCREENINGS       LAB, EKG AND DIAGNOSTIC RESULTS   Labs:  Recent Results (from the past 12 hours)   EKG 12 Lead    Collection Time: 12/13/23  2:33 PM   Result Value Ref Range    Ventricular Rate 66 BPM    Atrial Rate 66 BPM    P-R Interval 140 ms    QRS Duration 84 ms    Q-T Interval 382 ms    QTc Calculation (Bazett) 400 ms  P Axis 54 degrees    R Axis 47 degrees    T Axis 28 degrees    Diagnosis       Normal sinus rhythm with sinus arrhythmia  Normal ECG  No previous ECGs available     CBC with Auto Differential    Collection Time: 12/13/23  3:00 PM   Result Value Ref Range    WBC 7.3 4.6 - 13.2 K/uL    RBC 4.63 4.20 - 5.30 M/uL    Hemoglobin 13.7 12.0 - 16.0 g/dL     Hematocrit 59.5 64.9 - 45.0 %    MCV 87.3 78.0 - 100.0 FL    MCH 29.6 24.0 - 34.0 PG    MCHC 33.9 31.0 - 37.0 g/dL    RDW 87.4 88.3 - 85.4 %    Platelets 311 135 - 420 K/uL    MPV 9.2 9.2 - 11.8 FL    Nucleated RBCs 0.0 0.0 PER 100 WBC    nRBC 0.00 0.00 - 0.01 K/uL    Neutrophils % 62.9 40.0 - 73.0 %    Lymphocytes % 26.3 21.0 - 52.0 %    Monocytes % 6.7 3.0 - 10.0 %    Eosinophils % 2.9 0.0 - 5.0 %    Basophils % 1.1 0.0 - 2.0 %    Immature Granulocytes % 0.1 0 - 0.5 %    Neutrophils Absolute 4.56 1.80 - 8.00 K/UL    Lymphocytes Absolute 1.91 0.90 - 3.60 K/UL    Monocytes Absolute 0.49 0.05 - 1.20 K/UL    Eosinophils Absolute 0.21 0.00 - 0.40 K/UL    Basophils Absolute 0.08 0.00 - 0.10 K/UL    Immature Granulocytes Absolute 0.01 0.00 - 0.04 K/UL    Differential Type AUTOMATED     CMP    Collection Time: 12/13/23  3:00 PM   Result Value Ref Range    Sodium 138 136 - 145 mmol/L    Potassium 4.1 3.5 - 5.5 mmol/L    Chloride 102 98 - 107 mmol/L    CO2 28 21 - 32 mmol/L    Anion Gap 9 3 - 18 mmol/L    Glucose 85 74 - 108 mg/dL    BUN 11 6 - 23 mg/dL    Creatinine 8.80 9.39 - 1.30 mg/dL    BUN/Creatinine Ratio 9      Est, Glom Filt Rate 65 ml/min/1.65m2    Calcium 9.5 8.6 - 10.2 mg/dL    Total Bilirubin 0.4 0.2 - 1.0 mg/dL    AST 14 10 - 38 U/L    ALT 8 (L) 10 - 35 U/L    Alk Phosphatase 57 45 - 117 U/L    Total Protein 7.5 6.4 - 8.2 g/dL    Albumin 3.7 3.4 - 5.0 g/dL    Globulin 3.8 g/dL    Albumin/Globulin Ratio 1.0     D-Dimer, Quantitative    Collection Time: 12/13/23  3:00 PM   Result Value Ref Range    D-Dimer, Quant <0.27 <0.46 ug/ml(FEU)   HCG Qualitative, Serum    Collection Time: 12/13/23  3:00 PM   Result Value Ref Range    Preg, Serum Negative     Lipase    Collection Time: 12/13/23  3:00 PM   Result Value Ref Range    Lipase 43 13 - 75 U/L   Magnesium    Collection Time: 12/13/23  3:00 PM   Result Value Ref Range    Magnesium 2.6 1.6 -  2.6 mg/dL   Troponin    Collection Time: 12/13/23  3:00 PM   Result Value  Ref Range    Troponin T <6.0 0 - 14 ng/L   COVID-19 & Influenza Combo    Collection Time: 12/13/23  3:05 PM    Specimen: Nasopharynx   Result Value Ref Range    SARS-CoV-2, PCR Not Detected Not Detected      Rapid Influenza A By PCR Not Detected Not Detected      Rapid Influenza B By PCR Not Detected Not Detected               EKG:  1433 hrs.:  Normal sinus rhythm at 66 bpm.  Normal axis.  Normal intervals.  No acute ST or T wave changes.  No ectopy.  Conclusion: Normal EKG.    Radiologic Studies:  Radiographic images are visualized and preliminarily interpreted by the ED Provider with the following findings:     Interpretation per the Radiologist below, if available at the time of this note:  No orders to display        Records Reviewed:      MEDICAL DECISION MAKING and ED COURSE   4:01 PM Differential and Considerations of tests not ordered:     Pt needs evaluation chest pain - cvs, pulm, metab fxn.    Ddx  AMI, NSTEMI, arrhythmia, valvular dysfxn, PE , AD , ptx, er, metab dysfxn, electrolyte abn, pna, pericarditis, musculoskel, anxiety    Doubt AD: no (1) high-risk (Marfans, FH aortic ds, kn aortic valve ds, recent aortic manipulation, kn thoracic aortic aneurysm); (2) high-risk pain features (chest/back/abd pn-above&below diaphragm,abrupt, severe, tearing, ripping); (3) high-risk exam (pulse deficit; BP differential; focal neuro-l deficit;new murmur w/ aortic regurg, hypotension, shock). (4) Mediastinal widening-CXR.  Doubt cardiac illness due to history, normal EKG, no risk factors.    Risk factors: Recreational vaping.     Suspect: Musculoskeletal chest pain.     Plan: The patient will be placed on a cardiac monitor and serial assessments will be done and correlated with diagnostic data as it becomes available.          Vitals:    Vitals:    12/13/23 1450 12/13/23 1545   BP: 128/82 120/84   Pulse: 68 63   Resp: 16 16   Temp: 98 F (36.7 C)    SpO2: 100% 100%   Weight: 87.1 kg (192 lb)    Height: 1.702 m (5'  7)        ED COURSE     The patient has been placed on a cardiac monitor and serial assessments were done.  Patient remained in normal sinus rhythm without distress.  Patient stable for discharge.  Returned to the bedside and answered her questions.        Patient was given the following medications:  Medications - No data to display    CONSULTS: See ED Course/MDM for further details.  None     PROCEDURES   Unless otherwise noted above, none      CLINICAL IMPRESSIONS     1. Chest wall pain         SDOH/DISPOSITION/PLAN   Social Determinants affecting Treatment Plan:    DISPOSITION Decision To Discharge 12/13/2023 03:59:42 PM   DISPOSITION CONDITION Stable              PATIENT REFERRED TO:  Dulce Ronal SAUNDERS, APRN - CNP  [address]  [place]  [place] [address]  [phone]    Schedule an appointment as soon as possible for a visit in 3 days  For follow-up on your chest pain evaluated today here in the emergency department        DISCHARGE MEDICATIONS:     Medication List        ASK your doctor about these medications      medroxyPROGESTERone  150 MG/ML injection  Commonly known as: DEPO-PROVERA   Inject 150 mg into the muscle every 3 months     sertraline  25 MG tablet  Commonly known as: ZOLOFT   Take 1 tablet by mouth daily                DISCONTINUED MEDICATIONS:  Current Discharge Medication List          I am the Primary Clinician of Record. Ronal JINNY Birchwood, MD (electronically signed)    (Please note that parts of this dictation were completed with voice recognition software. Quite often unanticipated grammatical, syntax, homophones, and other interpretive errors are inadvertently transcribed by the computer software. Please disregards these errors. Please excuse any errors that have escaped final proofreading.)       Birchwood Ronal JINNY, MD  12/13/23 1601    "

## 2023-12-13 NOTE — ED Triage Notes (Signed)
"  Pt states she started with mid sternal chest pain around 1030   states her mom gave her an advil and she tried to go to sleep   pain is reproducible  pt states she does vape   denies any heavy lifting or pulling  "

## 2023-12-13 NOTE — Discharge Instructions (Signed)
"  Return if worsens or changes.  Your testing results today were completely normal.  "
